# Patient Record
Sex: Female | Born: 1985 | Race: Black or African American | Hispanic: No | Marital: Single
Health system: Southern US, Community
[De-identification: ages and names within clinical notes are randomized; demographics above are authoritative.]

---

## 2004-01-23 ENCOUNTER — Ambulatory Visit (HOSPITAL_COMMUNITY): Admission: AD | Admit: 2004-01-23 | Discharge: 2004-01-23 | Payer: Self-pay | Admitting: *Deleted

## 2004-01-23 ENCOUNTER — Ambulatory Visit: Payer: Self-pay | Admitting: *Deleted

## 2005-11-30 IMAGING — US US OB COMP LESS 14 WK
1 series · 13 of 28 positions shown · non-contrast
Comparison: none

<!--  IDXRADR:ADDEND:BEGIN -->Addendum Begins<!--  IDXRADR:ADDEND:INNER_BEGIN -->The patient passed what was felt to be products of conception, and Dr. Banzai requested a repeat study to see if the hypoechoic collection had passed.  Reevaluation with transvaginal ultrasound shows a persistent hypoechoic collection within the endometrial cavity at the lower uterine segment which is unchanged since prior study performed earlier this morning.

 <!--  IDXRADR:ADDEND:INNER_END -->Addendum Ends
<!--  IDXRADR:ADDEND:END -->Clinical Data:   13 week 1 day gestational age by LMP.  Heavy vaginal bleeding.  
 OBSTETRICAL ULTRASOUND WITH TRANSVAGINAL:
 There is no evidence of an intrauterine gestational sac.  A heterogeneous hypoechoic collection is seen within the endometrial cavity of the lower uterine segment and in the endocervical canal measuring approximately 4.5 x 1.2 cm.  This is suspicious for blood clot likely due to spontaneous abortion in progress.  No normal appearing intrauterine gestational sac is seen.  There is no evidence of fibroids or other uterine abnormality.
 No adnexal masses or free fluid are identified by either transabdominal or transvaginal sonography.  The left ovary is normal in appearance.  The right ovary contains a small peripheral cyst measuring 2.3 x 1.7 x 2.1 cm, consistent with a corpus luteum cyst.

[Series 1: us ob comp less 14 wk · 0.29mm/px · 13 of 47 slices shown]
[im 2/47]
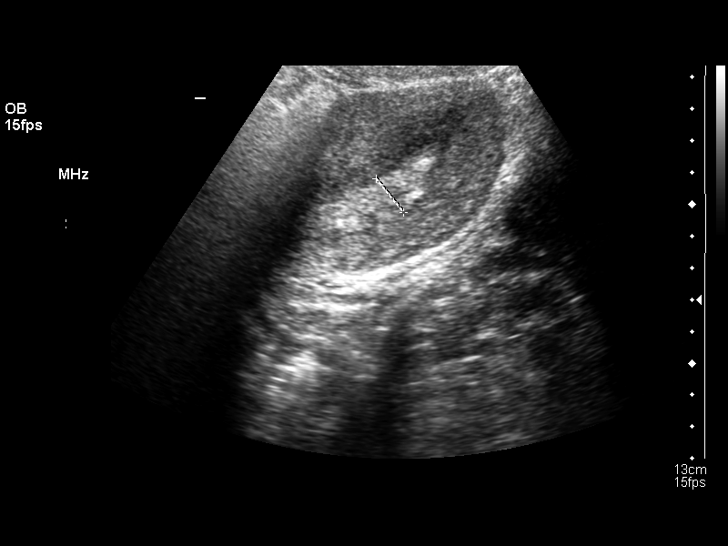
[im 6/47]
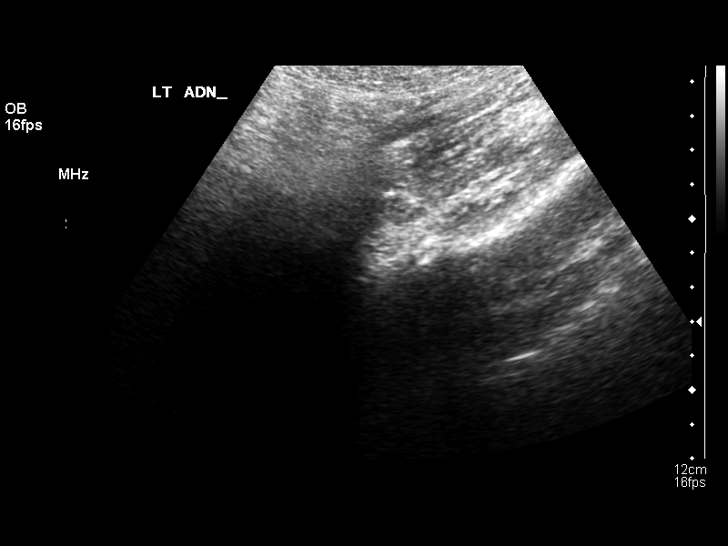
[im 9/47]
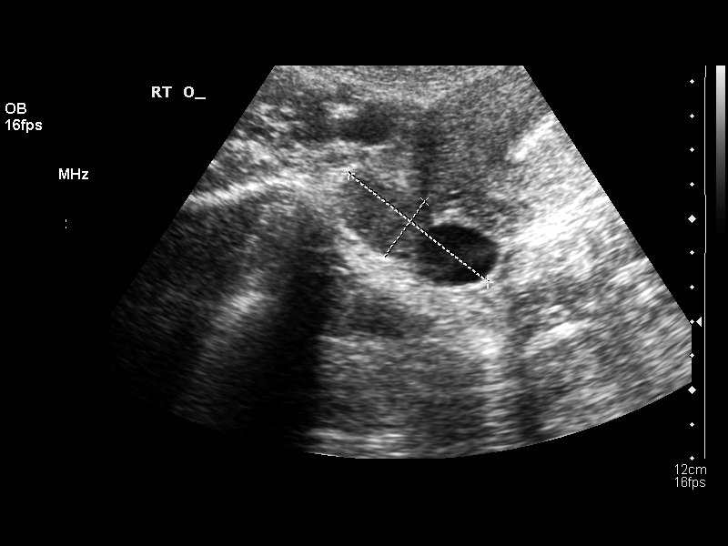
[im 12/47]
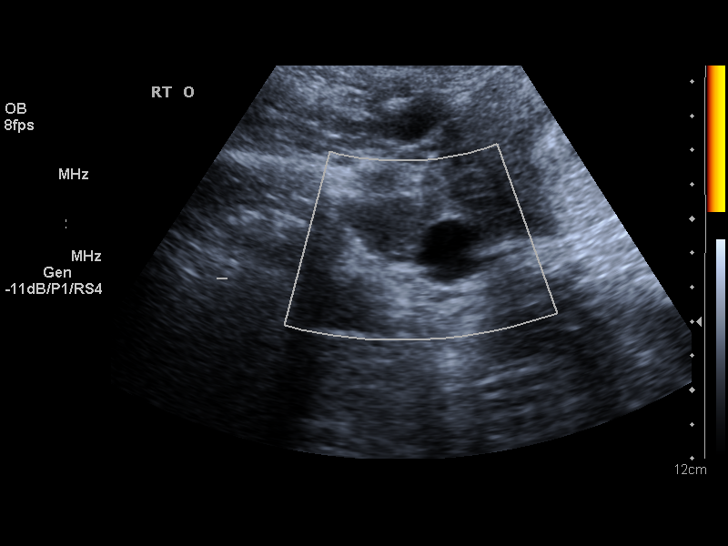
[im 16/47]
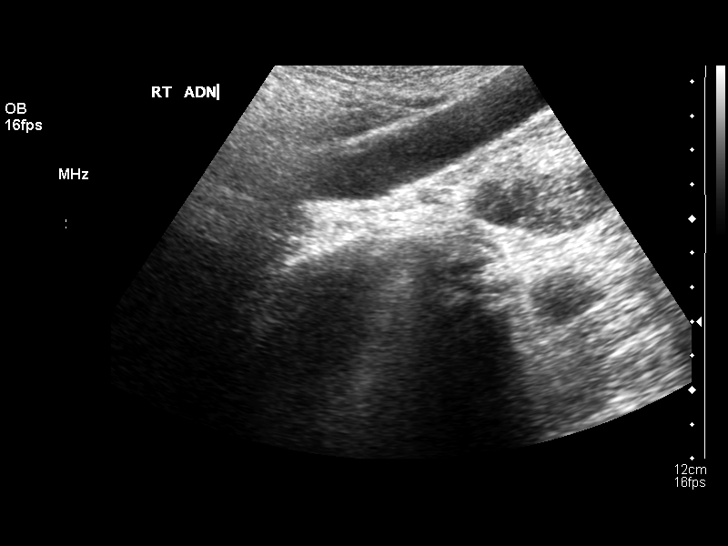
[im 19/47]
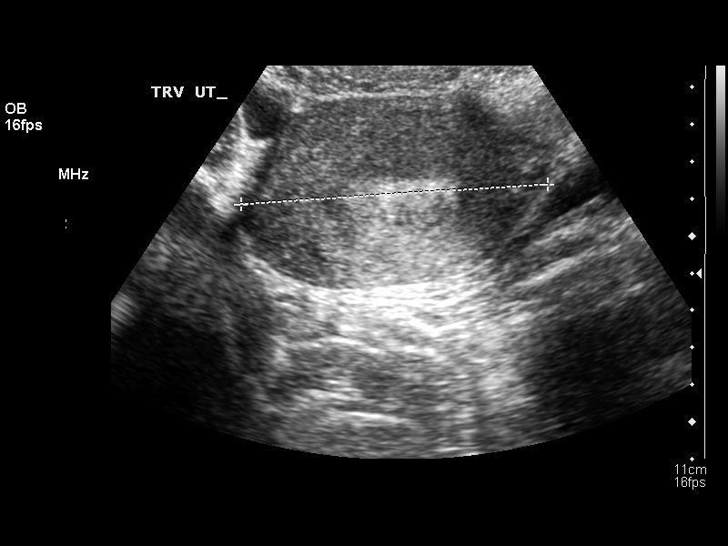
[im 24/47]
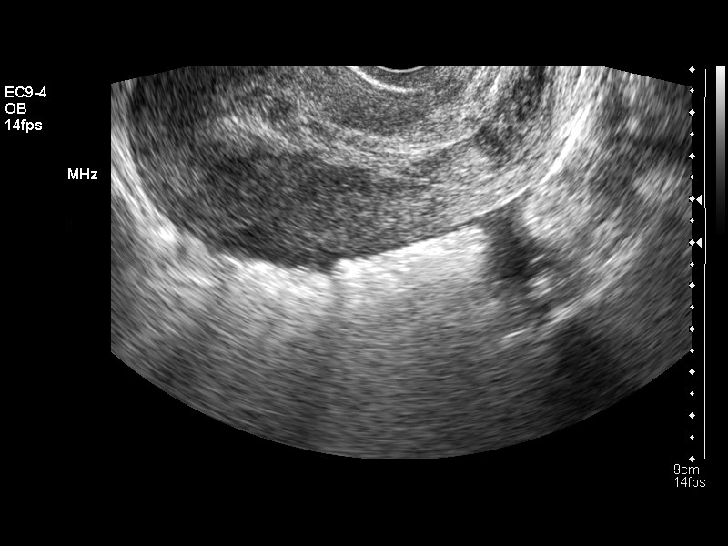
[im 28/47]
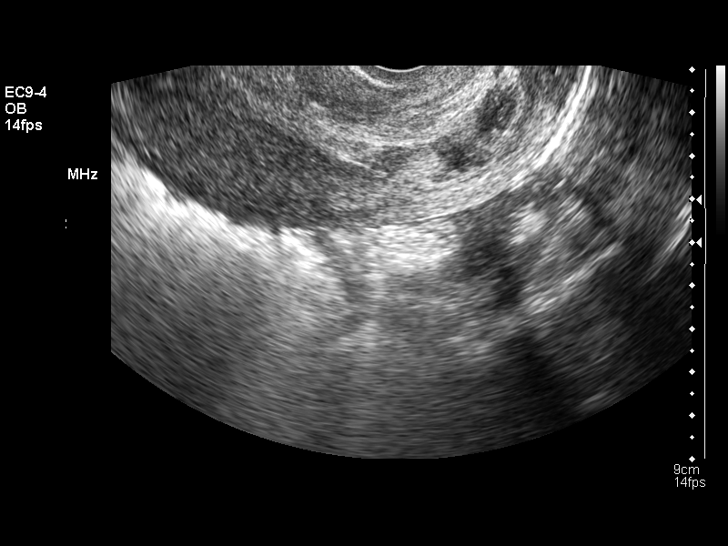
[im 31/47]
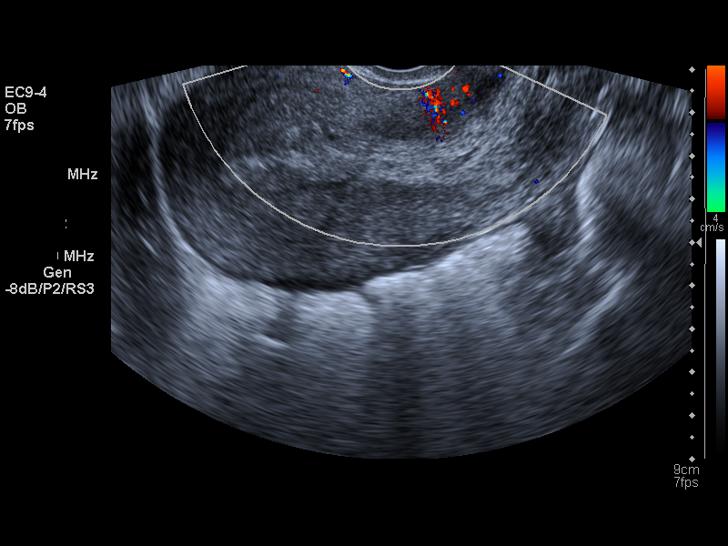
[im 35/47]
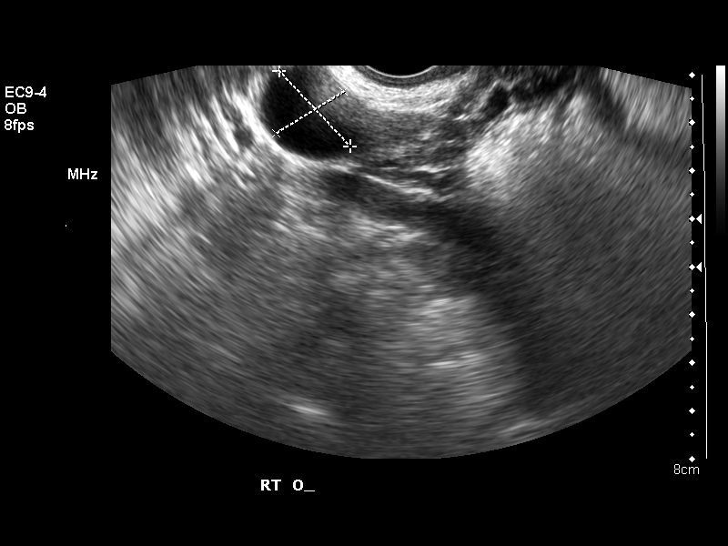
[im 38/47]
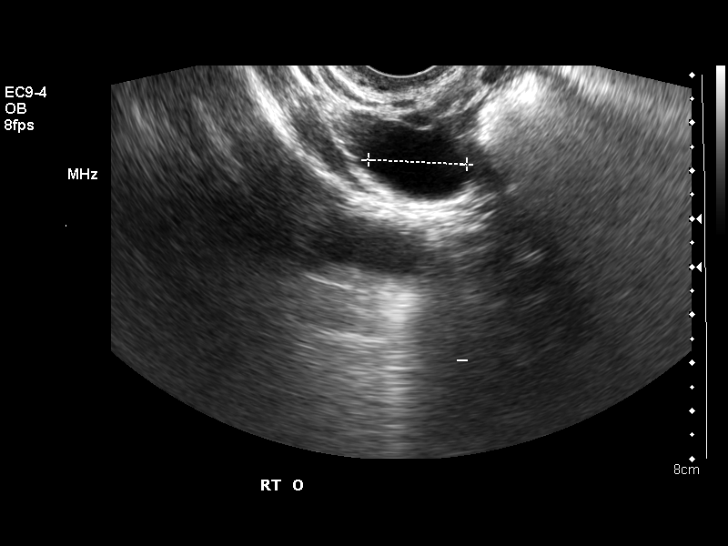
[im 41/47]
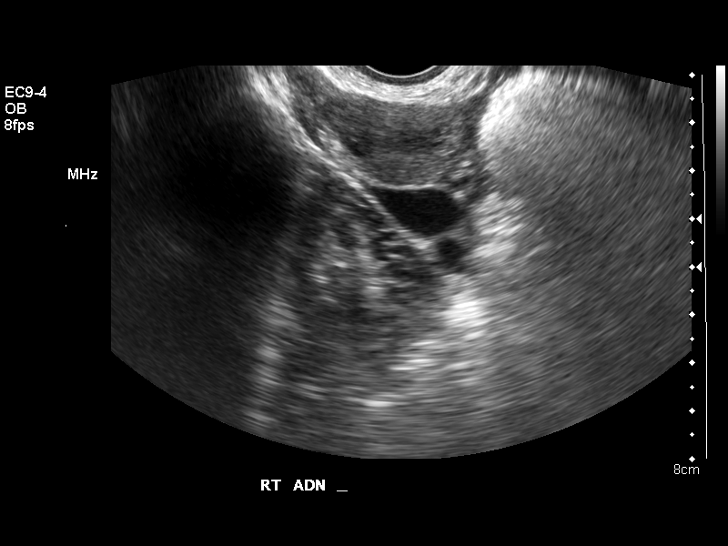
[im 45/47]
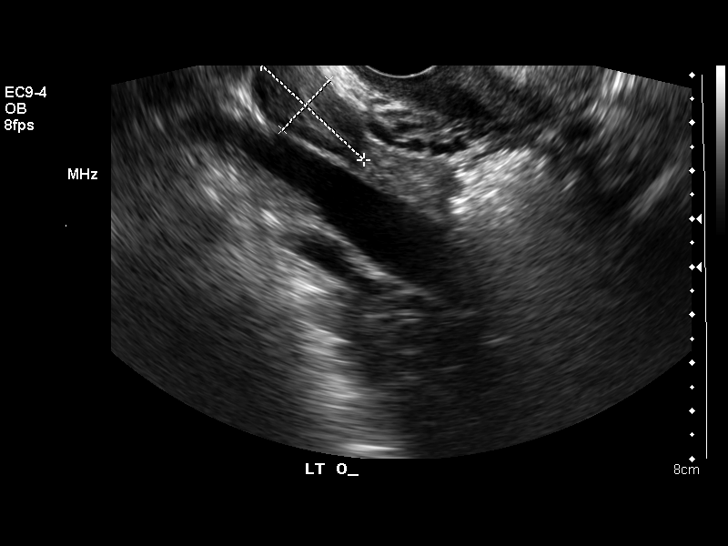

[13 of 28 positions shown; findings below may reference images not displayed]

IMPRESSION: 1.  Heterogeneous hypoechoic collection in the lower uterine segment and endocervical canal, consistent with blood clot.  This is suspicious for a incomplete abortion in progress.  Follow-up quantitative beta hCG levels or follow-up ultrasound is recommended for confirmation.
 2.  2 cm right ovarian cyst.  No evidence of adnexal masses or free fluid.

## 2018-07-09 ENCOUNTER — Other Ambulatory Visit: Payer: Self-pay | Admitting: Critical Care Medicine

## 2018-07-13 LAB — NOVEL CORONAVIRUS, NAA: SARS-CoV-2, NAA: NOT DETECTED

## 2018-08-24 ENCOUNTER — Other Ambulatory Visit: Payer: Self-pay | Admitting: *Deleted

## 2018-08-27 ENCOUNTER — Telehealth: Payer: Self-pay

## 2018-08-27 NOTE — Telephone Encounter (Signed)
Pt requested that the negative Covid-19 test results be mailed to her home. Address verified Copied from [email or web address] [phone]. Topic: General - Other >> Aug 27, 2018  2:53 PM Yvette Rack wrote: Reason for CRM: Pt called for Covid-19 test results. Informed pt that the results are negative.

## 2018-08-27 NOTE — Telephone Encounter (Signed)
Per pt. Request, mailed pt's COVID test results to home address.
